# Patient Record
Sex: Female | Born: 1940 | Race: White | Hispanic: No | Marital: Married | Smoking: Former smoker
Health system: Southern US, Community
[De-identification: ages and names within clinical notes are randomized; demographics above are authoritative.]

## PROBLEM LIST (undated history)

## (undated) DIAGNOSIS — I10 Essential (primary) hypertension: Secondary | ICD-10-CM

## (undated) DIAGNOSIS — M4800 Spinal stenosis, site unspecified: Secondary | ICD-10-CM

## (undated) DIAGNOSIS — H409 Unspecified glaucoma: Secondary | ICD-10-CM

## (undated) DIAGNOSIS — E785 Hyperlipidemia, unspecified: Secondary | ICD-10-CM

## (undated) HISTORY — PX: TONSILLECTOMY: SUR1361

## (undated) HISTORY — PX: APPENDECTOMY: SHX54

## (undated) HISTORY — PX: CRANIOTOMY: SHX93

## (undated) HISTORY — PX: OTHER SURGICAL HISTORY: SHX169

---

## 2013-10-11 ENCOUNTER — Encounter: Payer: Self-pay | Admitting: Emergency Medicine

## 2013-10-11 ENCOUNTER — Emergency Department (INDEPENDENT_AMBULATORY_CARE_PROVIDER_SITE_OTHER)
Admission: EM | Admit: 2013-10-11 | Discharge: 2013-10-11 | Disposition: A | Discharge status: Home / Self Care | Payer: Medicare Other | Source: Home / Self Care | Attending: Family Medicine | Admitting: Family Medicine

## 2013-10-11 DIAGNOSIS — J069 Acute upper respiratory infection, unspecified: Secondary | ICD-10-CM

## 2013-10-11 DIAGNOSIS — H73011 Bullous myringitis, right ear: Secondary | ICD-10-CM

## 2013-10-11 DIAGNOSIS — H73019 Bullous myringitis, unspecified ear: Secondary | ICD-10-CM

## 2013-10-11 HISTORY — DX: Essential (primary) hypertension: I10

## 2013-10-11 HISTORY — DX: Hyperlipidemia, unspecified: E78.5

## 2013-10-11 MED ORDER — AZITHROMYCIN 250 MG PO TABS: ORAL_TABLET | ORAL | Status: DC

## 2013-10-11 MED ORDER — BENZONATATE 200 MG PO CAPS: 200.0000 mg | ORAL_CAPSULE | Freq: Every day | ORAL | Status: DC

## 2013-10-11 NOTE — ED Provider Notes (Signed)
CSN: [id number]     Arrival date & time 10/11/13  1721 History   First MD Initiated Contact with Patient 10/11/13 1748     Chief Complaint  Patient presents with  . URI  . Otalgia      HPI Comments: Patient developed URI symptoms 5 days ago with myalgias, fatigue, cough, hoarseness, headache, sore throat, and sinus congestion.  She has felt tightness in her anterior chest but no shortness of breath.  The history is provided by the patient.    Past Medical History  Diagnosis Date  . Hypertension   . Hyperlipidemia    Past Surgical History  Procedure Laterality Date  . Appendectomy    . Craniotomy    . Tonsillectomy    . Other surgical history      Infection in femur- drained, port added and removed   Family History  Problem Relation Age of Onset  . Hypertension Mother   . Diabetes Mother   . Alzheimer's disease Father    History  Substance Use Topics  . Smoking status: Former Games developer  . Smokeless tobacco: Never Used  . Alcohol Use: No   OB History   Grav Para Term Preterm Abortions TAB SAB Ect Mult Living                 Review of Systems + sore throat + cough + hoarseness No pleuritic pain No wheezing + nasal congestion + post-nasal drainage No sinus pain/pressure No itchy/red eyes + right earache No hemoptysis No SOB No fever, + chills No nausea No vomiting No abdominal pain No diarrhea No urinary symptoms No skin rash + fatigue + myalgias + headache Used OTC meds without relief  Allergies  Other  Home Medications   Current Outpatient Rx  Name  Route  Sig  Dispense  Refill  . azelastine (ASTELIN) 137 MCG/SPRAY nasal spray   Each Nare   Place into both nostrils 2 (two) times daily. Use in each nostril as directed         . fluticasone (VERAMYST) 27.5 MCG/SPRAY nasal spray   Nasal   Place 2 sprays into the nose daily.         Marland Kitchen levothyroxine (SYNTHROID, LEVOTHROID) 100 MCG tablet   Oral   Take 100 mcg by mouth daily before  breakfast.         . lisinopril (PRINIVIL,ZESTRIL) 10 MG tablet   Oral   Take 10 mg by mouth daily.         . nebivolol (BYSTOLIC) 10 MG tablet   Oral   Take 10 mg by mouth daily.         Marland Kitchen omeprazole (PRILOSEC) 40 MG capsule   Oral   Take 40 mg by mouth daily.         . pravastatin (PRAVACHOL) 40 MG tablet   Oral   Take 40 mg by mouth daily.         Marland Kitchen azithromycin (ZITHROMAX Z-PAK) 250 MG tablet      Take 2 tabs today; then begin one tab once daily for 4 more days.   6 each   0   . benzonatate (TESSALON) 200 MG capsule   Oral   Take 1 capsule (200 mg total) by mouth at bedtime.   12 capsule   0    BP 174/84  Pulse 68  Temp(Src) 98.1 F (36.7 C) (Oral)  Resp 14  Ht 5\' 9"  (1.753 m)  Wt 187 lb (84.823 kg)  BMI 27.60 kg/m2  SpO2 96% Physical Exam Nursing notes and Vital Signs reviewed. Appearance:  Patient appears healthy, stated age, and in no acute distress Eyes:  Pupils are equal, round, and reactive to light and accomodation.  Extraocular movement is intact.  Conjunctivae are not inflamed  Ears:  Canals normal.   Left tympanic membrane normal.  Right tympanic membrane is erythematous and bulging with several intact bullae present Nose:  Mildly congested turbinates.  No sinus tenderness.   Pharynx:  Normal Neck:  Supple.  Slightly tender shotty posterior nodes are palpated bilaterally  Lungs:  Clear to auscultation.  Breath sounds are equal.  Chest:  Distinct tenderness to palpation over the mid-sternum.  Heart:  Regular rate and rhythm without murmurs, rubs, or gallops.  Abdomen:  Nontender without masses or hepatosplenomegaly.  Bowel sounds are present.  No CVA or flank tenderness.  Extremities:  No edema.  No calf tenderness Skin:  No rash present.   ED Course  Procedures  none      MDM   1. Acute upper respiratory infections of unspecified site; suspect viral URI   2. Bullous myringitis of right ear; antibiotic choices are limited by  patient's multiple antibiotic allergies    Begin Z-pack.  Prescription written for Benzonatate [facility] to take at bedtime for night-time cough.  Take plain Mucinex (guaifenesin) twice daily for cough and congestion.  Increase fluid intake, rest. Continue nasal sprays.   Also recommend using saline nasal spray several times daily and saline nasal irrigation (AYR is a common brand) Stop all antihistamines for now, and other non-prescription cough/cold preparations. Followup with ENT in two days.    Lattie Haw, MD 10/11/13 [phone]

## 2013-10-11 NOTE — ED Notes (Signed)
Meredith Wilkins c/o uri x 5 days without fever. Developed right ear pain today.

## 2013-10-15 ENCOUNTER — Telehealth: Payer: Self-pay | Admitting: Emergency Medicine

## 2013-12-01 ENCOUNTER — Emergency Department (INDEPENDENT_AMBULATORY_CARE_PROVIDER_SITE_OTHER)
Admission: EM | Admit: 2013-12-01 | Discharge: 2013-12-01 | Disposition: A | Discharge status: Home / Self Care | Payer: Medicare Other | Source: Home / Self Care | Attending: Emergency Medicine | Admitting: Emergency Medicine

## 2013-12-01 ENCOUNTER — Emergency Department (INDEPENDENT_AMBULATORY_CARE_PROVIDER_SITE_OTHER): Payer: Medicare Other

## 2013-12-01 ENCOUNTER — Encounter: Payer: Self-pay | Admitting: Emergency Medicine

## 2013-12-01 DIAGNOSIS — S60221A Contusion of right hand, initial encounter: Secondary | ICD-10-CM

## 2013-12-01 DIAGNOSIS — S60229A Contusion of unspecified hand, initial encounter: Secondary | ICD-10-CM

## 2013-12-01 DIAGNOSIS — S60211A Contusion of right wrist, initial encounter: Secondary | ICD-10-CM

## 2013-12-01 DIAGNOSIS — M25439 Effusion, unspecified wrist: Secondary | ICD-10-CM

## 2013-12-01 DIAGNOSIS — S60219A Contusion of unspecified wrist, initial encounter: Secondary | ICD-10-CM

## 2013-12-01 DIAGNOSIS — M25539 Pain in unspecified wrist: Secondary | ICD-10-CM

## 2013-12-01 DIAGNOSIS — M7989 Other specified soft tissue disorders: Secondary | ICD-10-CM

## 2013-12-01 NOTE — ED Notes (Signed)
Meredith Wilkins fell and hit her hand on Sunday. She is concerned of a blood clot. The pain is a 4/10 on the pain scale.

## 2013-12-01 NOTE — ED Provider Notes (Signed)
CSN: [id number]     Arrival date & time 12/01/13  1022 History   First MD Initiated Contact with Patient 12/01/13 1023     Chief Complaint  Patient presents with  . Hand Injury    right hand injury    Patient is a 72 y.o. female presenting with hand injury. The history is provided by the patient.  Hand Injury Location:  Hand and wrist Time since incident:  5 days Injury: yes   Mechanism of injury: fall   Fall:    Fall occurred: While walking outside. Accidentally slipped. No loss of consciousness or head injury.   Impact surface:  Concrete   Point of impact:  Outstretched arms   Entrapped after fall: no   Wrist location:  R wrist Hand location:  R hand (Especially thumb) Pain details:    Quality:  Sharp   Radiates to:  Does not radiate   Severity:  Moderate   Onset quality:  Sudden   Timing:  Constant   Progression:  Unchanged Chronicity:  New Prior injury to area:  Unable to specify Relieved by:  Rest Worsened by:  Movement Ineffective treatments:  None tried Associated symptoms: decreased range of motion and swelling   Associated symptoms: no back pain, no fever, no muscle weakness, no neck pain, no numbness and no tingling   Risk factors: known bone disorder (Osteopenia)   Risk factors: no concern for non-accidental trauma and no frequent fractures     Past Medical History  Diagnosis Date  . Hypertension   . Hyperlipidemia    Past Surgical History  Procedure Laterality Date  . Appendectomy    . Craniotomy    . Tonsillectomy    . Other surgical history      Infection in femur- drained, port added and removed   Family History  Problem Relation Age of Onset  . Hypertension Mother   . Diabetes Mother   . Alzheimer's disease Father    History  Substance Use Topics  . Smoking status: Former Games developer  . Smokeless tobacco: Never Used  . Alcohol Use: No   OB History   Grav Para Term Preterm Abortions TAB SAB Ect Mult Living                 Review of  Systems  Constitutional: Negative for fever.  Musculoskeletal: Negative for back pain and neck pain.  All other systems reviewed and are negative.    Allergies  Other  Home Medications   Current Outpatient Rx  Name  Route  Sig  Dispense  Refill  . azelastine (ASTELIN) 137 MCG/SPRAY nasal spray   Each Nare   Place into both nostrils 2 (two) times daily. Use in each nostril as directed         . azithromycin (ZITHROMAX Z-PAK) 250 MG tablet      Take 2 tabs today; then begin one tab once daily for 4 more days.   6 each   0   . benzonatate (TESSALON) 200 MG capsule   Oral   Take 1 capsule (200 mg total) by mouth at bedtime.   12 capsule   0   . fluticasone (VERAMYST) 27.5 MCG/SPRAY nasal spray   Nasal   Place 2 sprays into the nose daily.         Marland Kitchen levothyroxine (SYNTHROID, LEVOTHROID) 100 MCG tablet   Oral   Take 100 mcg by mouth daily before breakfast.         . lisinopril (  PRINIVIL,ZESTRIL) 10 MG tablet   Oral   Take 10 mg by mouth daily.         . nebivolol (BYSTOLIC) 10 MG tablet   Oral   Take 10 mg by mouth daily.         Marland Kitchen omeprazole (PRILOSEC) 40 MG capsule   Oral   Take 40 mg by mouth daily.         . pravastatin (PRAVACHOL) 40 MG tablet   Oral   Take 40 mg by mouth daily.          BP 166/89  Pulse 56  Temp(Src) 98.2 F (36.8 C) (Oral)  Ht 5\' 9"  (1.753 m)  Wt 193 lb (87.544 kg)  BMI 28.49 kg/m2  SpO2 97% Physical Exam  Nursing note and vitals reviewed. Constitutional: She is oriented to person, place, and time. She appears well-developed and well-nourished. No distress.  HENT:  Head: Normocephalic and atraumatic.  Eyes: Conjunctivae and EOM are normal. Pupils are equal, round, and reactive to light. No scleral icterus.  Neck: Normal range of motion.  Cardiovascular: Normal rate.   Pulmonary/Chest: Effort normal.  Abdominal: She exhibits no distension.  Musculoskeletal:       Right wrist: She exhibits decreased range of  motion, bony tenderness (Distal radius, but nontender over the navicular) and swelling. She exhibits no laceration.       Right hand: She exhibits decreased range of motion, bony tenderness (Especially proximal thumb) and swelling. Normal sensation noted. Normal strength noted.  Diffuse ecchymosis right hand and right wrist, especially right first metacarpal area and distal radius. Skin is intact. No red streaks. No cords or sign of phlebitis. No fluctuance. Neurovascular distally intact.  Neurological: She is alert and oriented to person, place, and time.  Skin: Skin is warm. No rash noted.  Psychiatric: She has a normal mood and affect. Her behavior is normal. Cognition and memory are normal.    ED Course  Procedures (including critical care time) Labs Review Labs Reviewed - No data to display Imaging Review RIGHT WRIST - COMPLETE 3+ VIEW  COMPARISON: None.  FINDINGS:  No fracture or dislocation is seen.  The joint spaces are preserved.  Mild soft tissue swelling along the dorsal wrist.  IMPRESSION:  No fracture or dislocation is seen.  Electronically Signed  By: Charline Bills M.D.  On: 12/01/2013 11:38  EXAM:  RIGHT HAND - COMPLETE 3+ VIEW  COMPARISON: None.  FINDINGS:  No fracture or dislocation is seen.  The joint spaces are preserved.  Visualized soft tissues are grossly unremarkable.  IMPRESSION:  No fracture or dislocation is seen.  Electronically Signed  By: Charline Bills M.D.  On: 12/01/2013 11:37  EKG Interpretation    Date/Time:    Ventricular Rate:    PR Interval:    QRS Duration:   QT Interval:    QTC Calculation:   R Axis:     Text Interpretation:              MDM   1. Contusion, wrist, right, initial encounter   2. Contusion of right hand, initial encounter    Reviewed with patient that x-rays right hand and right wrist and negative for any fracture or dislocation. Placed in Ace bandage and thumb Spica R wrist splint.--These  significantly reduced her pain. Encourage rest, ice, compression with ACE bandage, and elevation of injured body part. Discussed OTC pain meds such as Tylenol or ibuprofen up to 600 mg TID PC. She declined prescription for  pain med Follow-up with orthopedist within one week, sooner pay for certain symptoms. Precautions discussed. Red flags discussed. Questions invited and answered. Patient voiced understanding and agreement.     Lajean Manes, MD 12/03/13 [phone]

## 2015-01-14 ENCOUNTER — Encounter: Payer: Self-pay | Admitting: Emergency Medicine

## 2015-01-14 ENCOUNTER — Emergency Department (INDEPENDENT_AMBULATORY_CARE_PROVIDER_SITE_OTHER)
Admission: EM | Admit: 2015-01-14 | Discharge: 2015-01-14 | Disposition: A | Discharge status: Home / Self Care | Payer: Medicare Other | Source: Home / Self Care | Attending: Family Medicine | Admitting: Family Medicine

## 2015-01-14 DIAGNOSIS — R300 Dysuria: Secondary | ICD-10-CM

## 2015-01-14 DIAGNOSIS — N3090 Cystitis, unspecified without hematuria: Secondary | ICD-10-CM

## 2015-01-14 HISTORY — DX: Unspecified glaucoma: H40.9

## 2015-01-14 HISTORY — DX: Spinal stenosis, site unspecified: M48.00

## 2015-01-14 LAB — POCT URINALYSIS DIP (MANUAL ENTRY)
BILIRUBIN UA: NEGATIVE
GLUCOSE UA: NEGATIVE
Ketones, POC UA: NEGATIVE
Nitrite, UA: NEGATIVE
SPEC GRAV UA: 1.015 (ref 1.005–1.03)
UROBILINOGEN UA: 0.2 (ref 0–1)
pH, UA: 8.5 (ref 5–8)

## 2015-01-14 MED ORDER — NITROFURANTOIN MONOHYD MACRO 100 MG PO CAPS: 100.0000 mg | ORAL_CAPSULE | Freq: Two times a day (BID) | ORAL | Status: AC

## 2015-01-14 MED ORDER — NITROFURANTOIN MONOHYD MACRO 100 MG PO CAPS: 100.0000 mg | ORAL_CAPSULE | Freq: Two times a day (BID) | ORAL | Status: DC

## 2015-01-14 NOTE — ED Provider Notes (Signed)
CSN: [id number]     Arrival date & time 01/14/15  1414 History   First MD Initiated Contact with Patient 01/14/15 1453     Chief Complaint  Patient presents with  . Dysuria      HPI Comments: Patient complains of three day history of cloudy malodorous urine, developing dysuria last night.  No abdominal pain or fevers, chills, and sweats.  Patient is a 74 y.o. female presenting with dysuria. The history is provided by the patient.  Dysuria Pain quality:  Burning Pain severity:  Mild Onset quality:  Gradual Duration:  3 days Timing:  Constant Progression:  Worsening Chronicity:  New Recent urinary tract infections: no   Relieved by:  None tried Worsened by:  Nothing tried Ineffective treatments:  None tried Urinary symptoms: discolored urine, foul-smelling urine, frequent urination and incontinence   Urinary symptoms: no hematuria and no hesitancy   Associated symptoms: no abdominal pain, no fever, no flank pain, no nausea and no vomiting   Risk factors: recurrent urinary tract infections     Past Medical History  Diagnosis Date  . Hypertension   . Hyperlipidemia   . Spinal stenosis   . Glaucoma    Past Surgical History  Procedure Laterality Date  . Appendectomy    . Craniotomy    . Tonsillectomy    . Other surgical history      Infection in femur- drained, port added and removed   Family History  Problem Relation Age of Onset  . Hypertension Mother   . Diabetes Mother   . Alzheimer's disease Father    History  Substance Use Topics  . Smoking status: Former Games developermoker  . Smokeless tobacco: Never Used  . Alcohol Use: No   OB History    No data available     Review of Systems  Constitutional: Negative for fever.  Gastrointestinal: Negative for nausea, vomiting and abdominal pain.  Genitourinary: Positive for dysuria. Negative for flank pain.  All other systems reviewed and are negative.   Allergies  Other  Home Medications   Prior to Admission medications    Medication Sig Start Date End Date Taking? Authorizing Provider  azelastine (ASTELIN) 137 MCG/SPRAY nasal spray Place into both nostrils 2 (two) times daily. Use in each nostril as directed    Historical Provider, MD  fluticasone (VERAMYST) 27.5 MCG/SPRAY nasal spray Place 2 sprays into the nose daily.    Historical Provider, MD  levothyroxine (SYNTHROID, LEVOTHROID) 100 MCG tablet Take 100 mcg by mouth daily before breakfast.    Historical Provider, MD  lisinopril (PRINIVIL,ZESTRIL) 10 MG tablet Take 10 mg by mouth daily.    Historical Provider, MD  nebivolol (BYSTOLIC) 10 MG tablet Take 10 mg by mouth daily.    Historical Provider, MD  nitrofurantoin, macrocrystal-monohydrate, (MACROBID) 100 MG capsule Take 1 capsule (100 mg total) by mouth 2 (two) times daily. Take with food. 01/14/15   Lattie HawStephen A Alissia Lory, MD  omeprazole (PRILOSEC) 40 MG capsule Take 40 mg by mouth daily.    Historical Provider, MD  pravastatin (PRAVACHOL) 40 MG tablet Take 40 mg by mouth daily.    Historical Provider, MD   BP 151/86 mmHg  Pulse 75  Temp(Src) 97.9 F (36.6 C) (Oral)  Ht 5\' 9"  (1.753 m)  Wt 208 lb (94.348 kg)  BMI 30.70 kg/m2  SpO2 96% Physical Exam Nursing notes and Vital Signs reviewed. Appearance:  Patient appears stated age, and in no acute distress.  Patient is obese (BMI 30.7) Eyes:  Pupils are equal, round, and reactive to light and accomodation.  Extraocular movement is intact.  Conjunctivae are not inflamed  Nose:  Normal Pharynx:  Normal; moist mucous membranes  Neck:  Supple.   No adenopathy Lungs:  Clear to auscultation.  Breath sounds are equal.  Heart:  Regular rate and rhythm without murmurs, rubs, or gallops.  Abdomen:  Nontender without masses or hepatosplenomegaly.  Bowel sounds are present.  No CVA or flank tenderness.  Extremities:  No edema.  No calf tenderness Skin:  No rash present.   ED Course  Procedures  None     Labs Reviewed  URINE CULTURE  POCT URINALYSIS DIP (MANUAL  ENTRY):  BLO small; PRO trace; LEU large; otherwise negative      MDM   1. Dysuria   2. Cystitis    Urine culture pending.  Begin Macrobid  BID for one week. Continue increased fluid intake. If symptoms become significantly worse during the night or over the weekend, proceed to the local emergency room.  Followup with Family Doctor if not improved in one week.     Lattie Haw, MD 01/14/15 [phone]

## 2015-01-14 NOTE — Discharge Instructions (Signed)
Continue increased fluid intake. °If symptoms become significantly worse during the night or over the weekend, proceed to the local emergency room.  ° ° °Urinary Tract Infection °Urinary tract infections (UTIs) can develop anywhere along your urinary tract. Your urinary tract is your body's drainage system for removing wastes and extra water. Your urinary tract includes two kidneys, two ureters, a bladder, and a urethra. Your kidneys are a pair of bean-shaped organs. Each kidney is about the size of your fist. They are located below your ribs, one on each side of your spine. °CAUSES °Infections are caused by microbes, which are microscopic organisms, including fungi, viruses, and bacteria. These organisms are so small that they can only be seen through a microscope. Bacteria are the microbes that most commonly cause UTIs. °SYMPTOMS  °Symptoms of UTIs may vary by age and gender of the patient and by the location of the infection. Symptoms in young women typically include a frequent and intense urge to urinate and a painful, burning feeling in the bladder or urethra during urination. Older women and men are more likely to be tired, shaky, and weak and have muscle aches and abdominal pain. A fever may mean the infection is in your kidneys. Other symptoms of a kidney infection include pain in your back or sides below the ribs, nausea, and vomiting. °DIAGNOSIS °To diagnose a UTI, your caregiver will ask you about your symptoms. Your caregiver also will ask to provide a urine sample. The urine sample will be tested for bacteria and white blood cells. White blood cells are made by your body to help fight infection. °TREATMENT  °Typically, UTIs can be treated with medication. Because most UTIs are caused by a bacterial infection, they usually can be treated with the use of antibiotics. The choice of antibiotic and length of treatment depend on your symptoms and the type of bacteria causing your infection. °HOME CARE  INSTRUCTIONS °· If you were prescribed antibiotics, take them exactly as your caregiver instructs you. Finish the medication even if you feel better after you have only taken some of the medication. °· Drink enough water and fluids to keep your urine clear or pale yellow. °· Avoid caffeine, tea, and carbonated beverages. They tend to irritate your bladder. °· Empty your bladder often. Avoid holding urine for long periods of time. °· Empty your bladder before and after sexual intercourse. °· After a bowel movement, women should cleanse from front to back. Use each tissue only once. °SEEK MEDICAL CARE IF:  °· You have back pain. °· You develop a fever. °· Your symptoms do not begin to resolve within 3 days. °SEEK IMMEDIATE MEDICAL CARE IF:  °· You have severe back pain or lower abdominal pain. °· You develop chills. °· You have nausea or vomiting. °· You have continued burning or discomfort with urination. °MAKE SURE YOU:  °· Understand these instructions. °· Will watch your condition. °· Will get help right away if you are not doing well or get worse. °Document Released: 09/02/2005 Document Revised: 05/24/2012 Document Reviewed: 01/01/2012 °ExitCare® Patient Information ©2015 [employer]. This information is not intended to replace advice given to you by your health care provider. Make sure you discuss any questions you have with your health care provider. ° °

## 2015-01-14 NOTE — ED Notes (Signed)
Dysuria, cloudy urine, shy bladder x 5 days

## 2015-01-17 ENCOUNTER — Telehealth: Payer: Self-pay | Admitting: *Deleted

## 2015-01-17 LAB — URINE CULTURE: Colony Count: >=100000

## 2015-02-18 ENCOUNTER — Emergency Department (INDEPENDENT_AMBULATORY_CARE_PROVIDER_SITE_OTHER)
Admission: EM | Admit: 2015-02-18 | Discharge: 2015-02-18 | Disposition: A | Discharge status: Home / Self Care | Payer: Medicare Other | Source: Home / Self Care | Attending: Family Medicine | Admitting: Family Medicine

## 2015-02-18 ENCOUNTER — Encounter: Payer: Self-pay | Admitting: *Deleted

## 2015-02-18 DIAGNOSIS — T7840XA Allergy, unspecified, initial encounter: Secondary | ICD-10-CM | POA: Diagnosis not present

## 2015-02-18 DIAGNOSIS — L304 Erythema intertrigo: Secondary | ICD-10-CM | POA: Diagnosis not present

## 2015-02-18 IMAGING — CR DG WRIST COMPLETE 3+V*R*
4 series · 4 of 4 positions shown · non-contrast
Comparison: None.

CLINICAL DATA: Fall, pain/swelling to hand and wrist

EXAM:
RIGHT WRIST - COMPLETE 3+ VIEW

[view not recorded (1 of 4)]
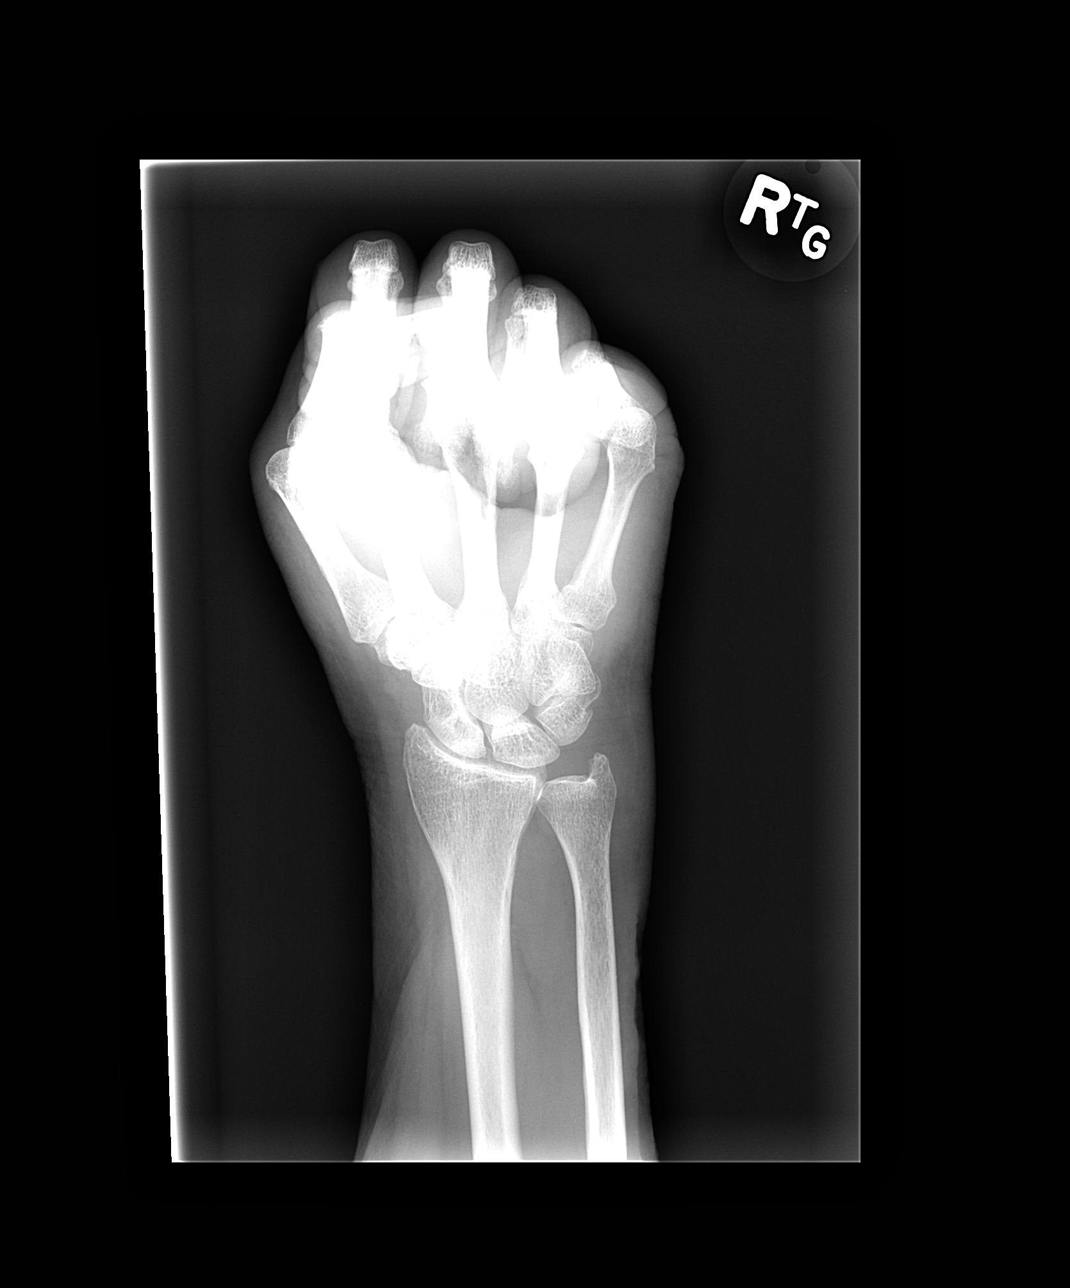

[view not recorded (2 of 4)]
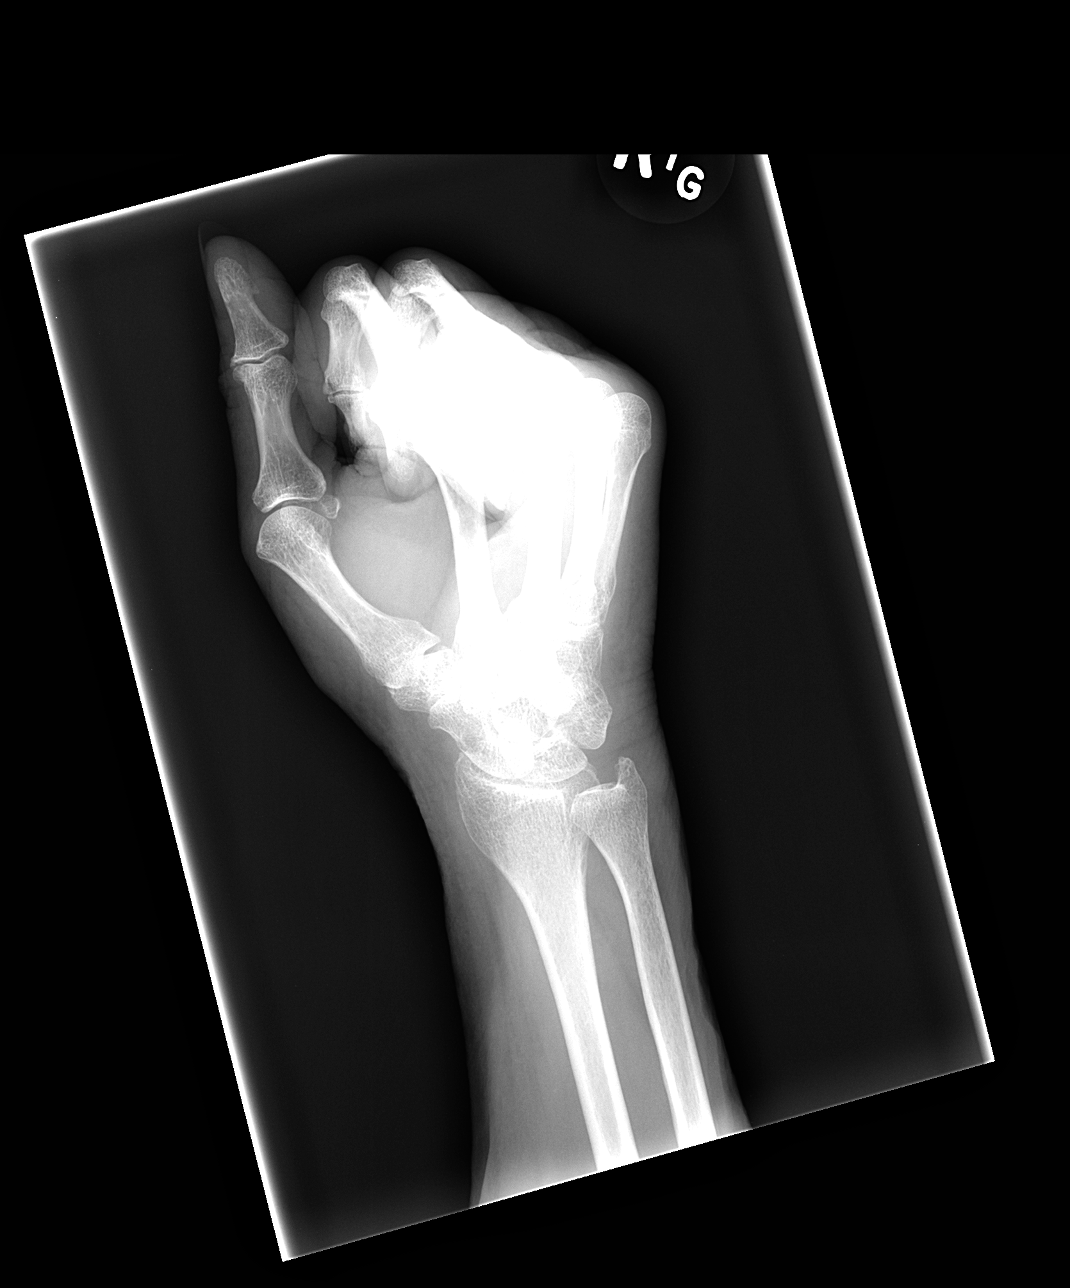

[view not recorded (3 of 4)]
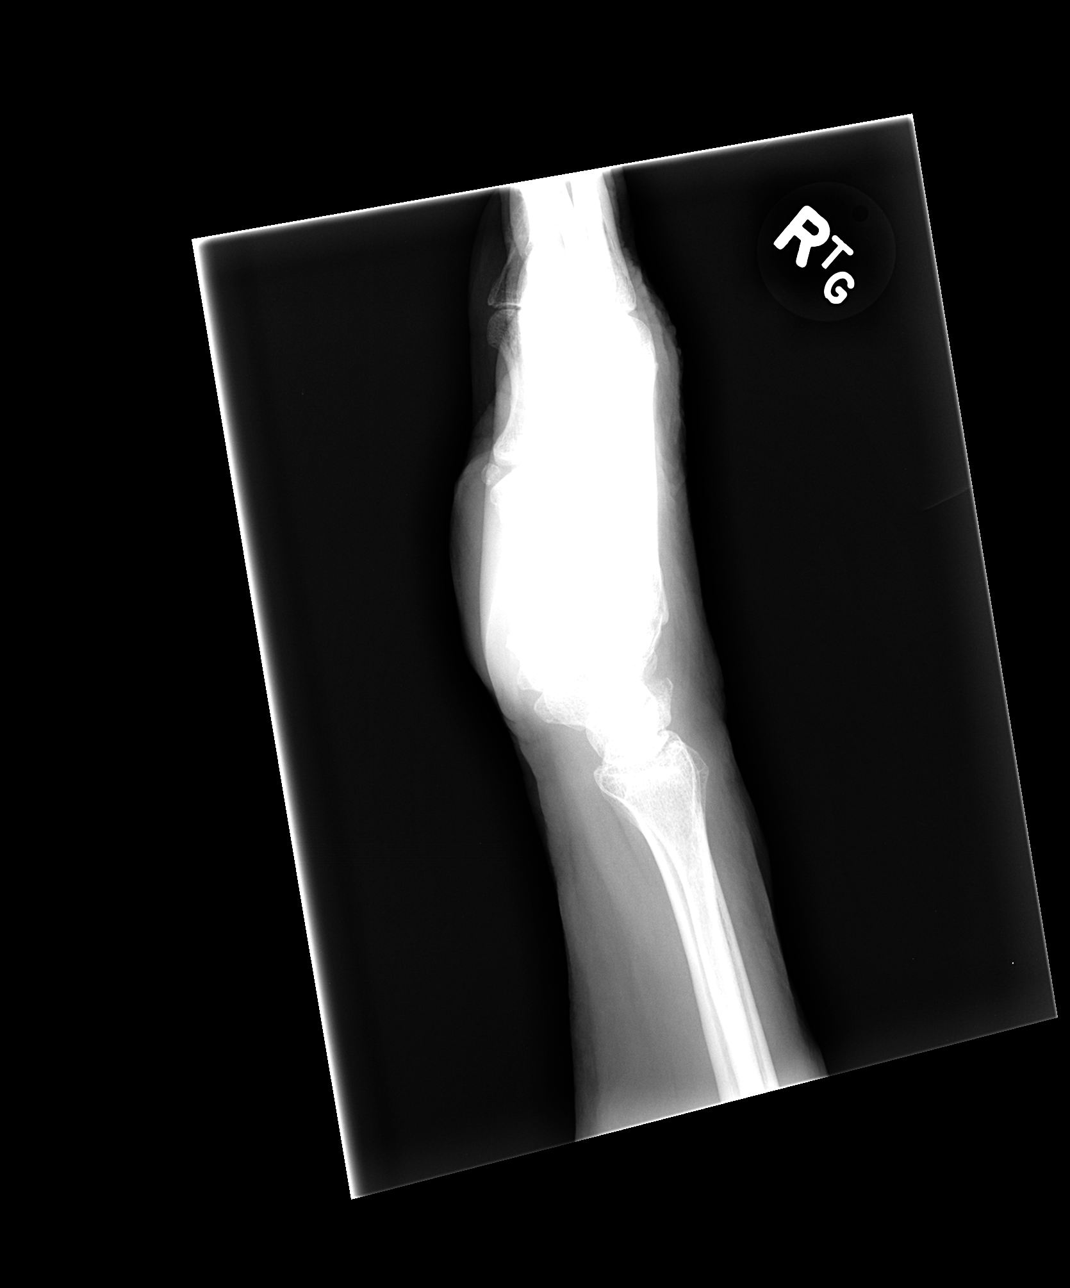

[view not recorded (4 of 4)]
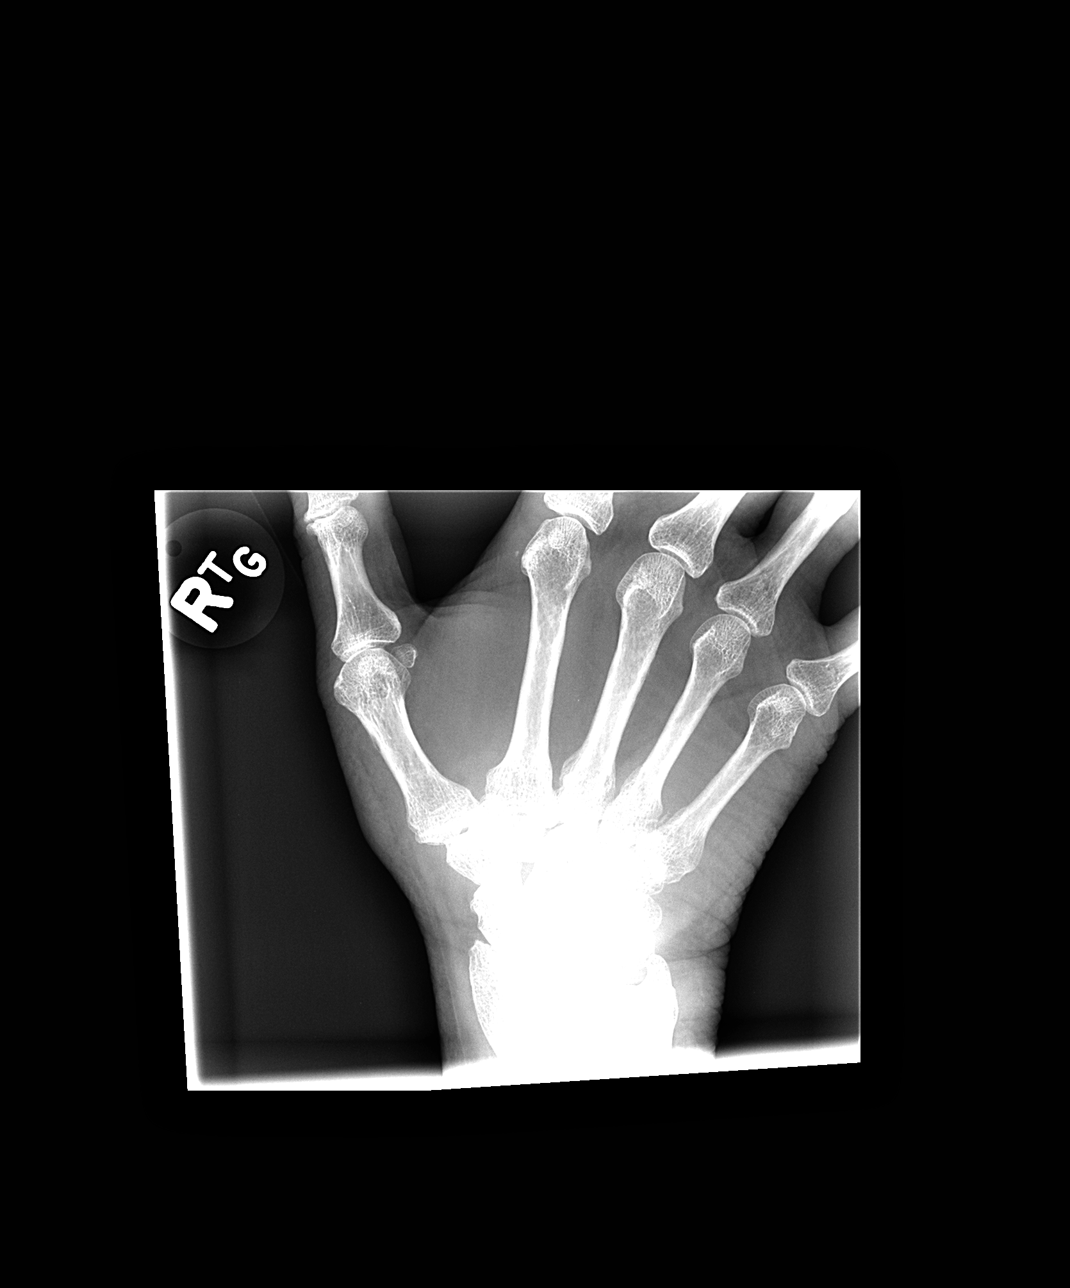

[4 of 4 positions shown; findings below may reference images not displayed]

FINDINGS: No fracture or dislocation is seen.

The joint spaces are preserved.

Mild soft tissue swelling along the dorsal wrist.
IMPRESSION: No fracture or dislocation is seen.

## 2015-02-18 NOTE — ED Notes (Signed)
Pt c/o allergic reaction with swelling of her hands and feet x 1 day. She also c/o raw, itchy area under her LT breast. She completed an ABT 2 days ago.

## 2015-02-18 NOTE — Discharge Instructions (Signed)
Take Benadryl 50mg  at bedtime tonight, then tomorrow resume Allegra daily.  Apply 1% Clotrimazole to rash under breasts twice daily until resolved.   Intertrigo Intertrigo is a skin condition that occurs in between folds of skin in places on the body that rub together a lot and do not get much ventilation. It is caused by heat, moisture, friction, sweat retention, and lack of air circulation, which produces red, irritated patches and, sometimes, scaling or drainage. People who have diabetes, who are obese, or who have treatment with antibiotics are at increased risk for intertrigo. The most common sites for intertrigo to occur include:  The groin.  The breasts.  The armpits.  Folds of abdominal skin.  Webbed spaces between the fingers or toes. Intertrigo may be aggravated by:  Sweat.  Feces.  Yeast or bacteria that are present near skin folds.  Urine.  Vaginal discharge. HOME CARE INSTRUCTIONS  The following steps can be taken to reduce friction and keep the affected area cool and dry:  Expose skin folds to the air.  Keep deep skin folds separated with cotton or linen cloth. Avoid tight fitting clothing that could cause chafing.  Wear open-toed shoes or sandals to help reduce moisture between the toes.  Apply absorbent powders to affected areas as directed by your caregiver.  Apply over-the-counter barrier pastes, such as zinc oxide, as directed by your caregiver.  If you develop a fungal infection in the affected area, your caregiver may have you use antifungal creams. SEEK MEDICAL CARE IF:   The rash is not improving after 1 week of treatment.  The rash is getting worse (more red, more swollen, more painful, or spreading).  You have a fever or chills. MAKE SURE YOU:   Understand these instructions.  Will watch your condition.  Will get help right away if you are not doing well or get worse. Document Released: 11/23/2005 Document Revised: 02/15/2012 Document  Reviewed: 05/08/2010 [facility] Patient Information 2015 [place], [place]. This information is not intended to replace advice given to you by your health care provider. Make sure you discuss any questions you have with your health care provider.

## 2015-02-18 NOTE — ED Provider Notes (Signed)
CSN: [id number]     Arrival date & time 02/18/15  1740 History   First MD Initiated Contact with Patient 02/18/15 1947     Chief Complaint  Patient presents with  . Allergic Reaction      HPI Comments: Patient reports that she fell one week ago, injuring her head resulting in a laceration.  No loss of consciousness.  She was evaluated in ER and laceration was sutured.  CT scan was negative except for presence of sinusitis.  She was subsequently treated with cefdinir.  She states that she received a Tdap three days ago. Three days ago she developed mild swelling of her hands and lower extremities.  She has also noted a pruritic rash beneath both breasts.  She feels well otherwise.  She states that she has finished taking cefdinir.  No shortness of breath or wheezing.  The history is provided by the patient.    Past Medical History  Diagnosis Date  . Hypertension   . Hyperlipidemia   . Spinal stenosis   . Glaucoma    Past Surgical History  Procedure Laterality Date  . Appendectomy    . Craniotomy    . Tonsillectomy    . Other surgical history      Infection in femur- drained, port added and removed   Family History  Problem Relation Age of Onset  . Hypertension Mother   . Diabetes Mother   . Alzheimer's disease Father    History  Substance Use Topics  . Smoking status: Former Games developer  . Smokeless tobacco: Never Used  . Alcohol Use: No   OB History    No data available     Review of Systems  Constitutional: Negative.   HENT: Negative.   Eyes: Negative.   Respiratory: Negative.   Cardiovascular:       Mild hand and lower leg swelling  Gastrointestinal: Negative.   Genitourinary: Negative.   Musculoskeletal: Negative.   Skin:       Pruritic rash beneath breasts.  All other systems reviewed and are negative.   Allergies  Other  Home Medications   Prior to Admission medications   Medication Sig Start Date End Date Taking? Authorizing Provider  azelastine  (ASTELIN) 137 MCG/SPRAY nasal spray Place into both nostrils 2 (two) times daily. Use in each nostril as directed    Historical Provider, MD  fluticasone (VERAMYST) 27.5 MCG/SPRAY nasal spray Place 2 sprays into the nose daily.    Historical Provider, MD  levothyroxine (SYNTHROID, LEVOTHROID) 100 MCG tablet Take 100 mcg by mouth daily before breakfast.    Historical Provider, MD  lisinopril (PRINIVIL,ZESTRIL) 10 MG tablet Take 10 mg by mouth daily.    Historical Provider, MD  nebivolol (BYSTOLIC) 10 MG tablet Take 10 mg by mouth daily.    Historical Provider, MD  nitrofurantoin, macrocrystal-monohydrate, (MACROBID) 100 MG capsule Take 1 capsule (100 mg total) by mouth 2 (two) times daily. Take with food. 01/14/15   Lattie Haw, MD  omeprazole (PRILOSEC) 40 MG capsule Take 40 mg by mouth daily.    Historical Provider, MD  pravastatin (PRAVACHOL) 40 MG tablet Take 40 mg by mouth daily.    Historical Provider, MD   BP 145/95 mmHg  Pulse 58  Temp(Src) 98.1 F (36.7 C) (Oral)  Resp 16  Ht  (1.753 m)  Wt 208 lb (94.348 kg)  BMI 30.70 kg/m2  SpO2 96% Physical Exam  Constitutional: She is oriented to person, place, and time. She appears well-developed  and well-nourished. No distress.  HENT:  Mouth/Throat: Oropharynx is clear and moist.  Eyes: Conjunctivae are normal. Pupils are equal, round, and reactive to light.  Neck: Neck supple.  Cardiovascular: Normal heart sounds.   Pulmonary/Chest: Breath sounds normal.    The intertriginous areas beneath both breasts are mildly erythematous as noted on diagram.  No weeping or tenderness to palpation.    Abdominal: There is no tenderness.  Lymphadenopathy:    She has no cervical adenopathy.  Neurological: She is alert and oriented to person, place, and time.  Skin: Skin is warm and dry. No rash noted.  Nursing note and vitals reviewed.   ED Course  Procedures  none   MDM   1. Allergic reaction, initial encounter; ?etiology  2.  Intertrigo     Take Benadryl 50mg  at bedtime tonight, then tomorrow resume Allegra daily.  Apply 1% Clotrimazole to rash under breasts twice daily until resolved.    Lattie HawStephen A Howie Rufus, MD 02/19/15 [phone]

## 2015-10-10 ENCOUNTER — Encounter: Payer: Self-pay | Admitting: Emergency Medicine

## 2015-10-10 ENCOUNTER — Emergency Department (INDEPENDENT_AMBULATORY_CARE_PROVIDER_SITE_OTHER)
Admission: EM | Admit: 2015-10-10 | Discharge: 2015-10-10 | Disposition: A | Discharge status: Home / Self Care | Payer: Medicare Other | Source: Home / Self Care | Attending: Family Medicine | Admitting: Family Medicine

## 2015-10-10 DIAGNOSIS — R300 Dysuria: Secondary | ICD-10-CM | POA: Diagnosis not present

## 2015-10-10 DIAGNOSIS — N3090 Cystitis, unspecified without hematuria: Secondary | ICD-10-CM

## 2015-10-10 LAB — POCT URINALYSIS DIP (MANUAL ENTRY)
BILIRUBIN UA: NEGATIVE
Bilirubin, UA: NEGATIVE
GLUCOSE UA: NEGATIVE
Nitrite, UA: NEGATIVE
Protein Ur, POC: 100 — AB
Spec Grav, UA: 1.015 (ref 1.005–1.03)
Urobilinogen, UA: 0.2 (ref 0–1)
pH, UA: 8.5 (ref 5–8)

## 2015-10-10 MED ORDER — PENICILLIN V POTASSIUM 500 MG PO TABS: 500.0000 mg | ORAL_TABLET | Freq: Three times a day (TID) | ORAL | Status: AC

## 2015-10-10 NOTE — ED Notes (Signed)
Dysuria x 2 days.

## 2015-10-10 NOTE — Discharge Instructions (Signed)
Continue increased fluid intake.  May use non-prescription AZO for about two days, if desired, to decrease urinary discomfort.   If symptoms become significantly worse during the night or over the weekend, proceed to the local emergency room.  

## 2015-10-10 NOTE — ED Provider Notes (Signed)
CSN: [id number]     Arrival date & time 10/10/15  1551 History   First MD Initiated Contact with Patient 10/10/15 1624     Chief Complaint  Patient presents with  . Dysuria      HPI Comments: Yesterday patient developed dysuria, frequency, hesitancy, intermittent incontinence, and cloudy urine.  She states that she has a UTI about every 3 months.  Patient is a 74 y.o. female presenting with dysuria. The history is provided by the patient.  Dysuria Pain quality:  Burning Pain severity:  Mild Onset quality:  Sudden Duration:  1 day Timing:  Constant Progression:  Worsening Chronicity:  Recurrent Recent urinary tract infections: yes   Relieved by:  None tried Ineffective treatments:  Phenazopyridine Urinary symptoms: frequent urination, hesitancy and incontinence   Urinary symptoms: no discolored urine, no foul-smelling urine and no hematuria   Associated symptoms: no abdominal pain, no fever, no flank pain and no nausea   Risk factors: recurrent urinary tract infections     Past Medical History  Diagnosis Date  . Hypertension   . Hyperlipidemia   . Spinal stenosis   . Glaucoma    Past Surgical History  Procedure Laterality Date  . Appendectomy    . Craniotomy    . Tonsillectomy    . Other surgical history      Infection in femur- drained, port added and removed   Family History  Problem Relation Age of Onset  . Hypertension Mother   . Diabetes Mother   . Alzheimer's disease Father    Social History  Substance Use Topics  . Smoking status: Former Games developermoker  . Smokeless tobacco: Never Used  . Alcohol Use: No   OB History    No data available     Review of Systems  Constitutional: Negative for fever.  Gastrointestinal: Negative for nausea and abdominal pain.  Genitourinary: Positive for dysuria. Negative for flank pain.  All other systems reviewed and are negative.   Allergies  Other  Home Medications   Prior to Admission medications   Medication Sig  Start Date End Date Taking? Authorizing Provider  azelastine (ASTELIN) 137 MCG/SPRAY nasal spray Place into both nostrils 2 (two) times daily. Use in each nostril as directed    Historical Provider, MD  fluticasone (VERAMYST) 27.5 MCG/SPRAY nasal spray Place 2 sprays into the nose daily.    Historical Provider, MD  levothyroxine (SYNTHROID, LEVOTHROID) 100 MCG tablet Take 100 mcg by mouth daily before breakfast.    Historical Provider, MD  lisinopril (PRINIVIL,ZESTRIL) 10 MG tablet Take 10 mg by mouth daily.    Historical Provider, MD  nebivolol (BYSTOLIC) 10 MG tablet Take 10 mg by mouth daily.    Historical Provider, MD  nitrofurantoin, macrocrystal-monohydrate, (MACROBID) 100 MG capsule Take 1 capsule (100 mg total) by mouth 2 (two) times daily. Take with food. 01/14/15   Lattie HawStephen A Beese, MD  omeprazole (PRILOSEC) 40 MG capsule Take 40 mg by mouth daily.    Historical Provider, MD  penicillin v potassium (VEETID) 500 MG tablet Take 1 tablet (500 mg total) by mouth 3 (three) times daily. 10/10/15   Lattie HawStephen A Beese, MD  pravastatin (PRAVACHOL) 40 MG tablet Take 40 mg by mouth daily.    Historical Provider, MD   Meds Ordered and Administered this Visit  Medications - No data to display  BP 131/83 mmHg  Pulse 70  Temp(Src) 98.3 F (36.8 C) (Oral)  Ht 5\' 9"  (1.753 m)  Wt 200 lb (90.719 kg)  BMI 29.52 kg/m2  SpO2 96% No data found.   Physical Exam Nursing notes and Vital Signs reviewed. Appearance:  Patient appears stated age, and in no acute distress Eyes:  Pupils are equal, round, and reactive to light and accomodation.  Extraocular movement is intact.  Conjunctivae are not inflamed  Nose:  Normal Pharynx:  Normal Neck:  Supple.  No adenopathy Lungs:  Clear to auscultation.  Breath sounds are equal.  Moving air well. Heart:  Regular rate and rhythm without murmurs, rubs, or gallops.  Abdomen:  Nontender without masses or hepatosplenomegaly.  Bowel sounds are present.  No CVA or flank  tenderness.  Extremities:  No edema.  No calf tenderness Skin:  No rash present.   ED Course  Procedures  None    Labs Reviewed  POCT URINALYSIS DIP (MANUAL ENTRY) - Abnormal; Notable for the following:    Clarity, UA cloudy (*)    Blood, UA moderate (*)    Protein Ur, POC =100 (*)    Leukocytes, UA moderate (2+) (*)    All other components within normal limits  URINE CULTURE    MDM   1. Dysuria   2. Cystitis    Urine culture pending.  Patient states that she can only take penicillin. Review of records:  Previous urine culture done 01/14/15 positive for proteus mirabilis sensitive to ampicillin. Begin Pen VK  Q8hr. Continue increased fluid intake.  May use non-prescription AZO for about two days, if desired, to decrease urinary discomfort.  If symptoms become significantly worse during the night or over the weekend, proceed to the local emergency room.  Followup with Family Doctor if not improved in one week.  Also recommend urology follow-up.    Lattie Haw, MD 10/10/15 [phone]

## 2015-10-13 ENCOUNTER — Telehealth: Payer: Self-pay | Admitting: Emergency Medicine

## 2015-10-13 LAB — URINE CULTURE

## 2022-10-22 ENCOUNTER — Encounter: Payer: Self-pay | Admitting: Podiatrist

## 2022-10-22 ENCOUNTER — Ambulatory Visit: Payer: Medicare PPO | Admitting: Podiatrist

## 2022-10-22 DIAGNOSIS — M79675 Pain in left toe(s): Secondary | ICD-10-CM | POA: Diagnosis not present

## 2022-10-22 DIAGNOSIS — B351 Tinea unguium: Secondary | ICD-10-CM

## 2022-10-22 DIAGNOSIS — M79674 Pain in right toe(s): Secondary | ICD-10-CM | POA: Diagnosis not present

## 2022-10-22 NOTE — Progress Notes (Signed)
Subjective: Meredith Wilkins is a 81 y.o. female patient who presents to office today with concern of long,mildly painful nails  while ambulating in shoes; unable to trim.  Patient denies any new cramping, numbness, burning or tingling in the legs or feet.  There are no problems to display for this patient.  Current Outpatient Medications on File Prior to Visit  Medication Sig Dispense Refill   azelastine (ASTELIN) 137 MCG/SPRAY nasal spray Place into both nostrils 2 (two) times daily. Use in each nostril as directed     fluticasone (VERAMYST) 27.5 MCG/SPRAY nasal spray Place 2 sprays into the nose daily.     levothyroxine (SYNTHROID, LEVOTHROID) 100 MCG tablet Take 100 mcg by mouth daily before breakfast.     lisinopril (PRINIVIL,ZESTRIL) 10 MG tablet Take 10 mg by mouth daily.     nebivolol (BYSTOLIC) 10 MG tablet Take 10 mg by mouth daily.     nitrofurantoin, macrocrystal-monohydrate, (MACROBID) 100 MG capsule Take 1 capsule (100 mg total) by mouth 2 (two) times daily. Take with food. 14 capsule 0   omeprazole (PRILOSEC) 40 MG capsule Take 40 mg by mouth daily.     penicillin v potassium (VEETID) 500 MG tablet Take 1 tablet (500 mg total) by mouth 3 (three) times daily. 21 tablet 0   pravastatin (PRAVACHOL) 40 MG tablet Take 40 mg by mouth daily.     No current facility-administered medications on file prior to visit.   Allergies  Allergen Reactions   Other Rash    PATIENT REPORTS SHE IS ALLERGIC TO ALL ANTIBIOTICS EXCEPT PENICILLIN     Objective: General: Patient is awake, alert, and oriented x 3 and in no acute distress.  Integument: Skin is warm, dry and supple bilateral. Nails are tender, long, thickened and  dystrophic with subungual debris, consistent with onychomycosis, 1-5 bilateral. No signs of infection. Hyperkeratotic tissue medial hallux bilateral.  Noted.   Vasculature:  Dorsalis Pedis pulse 2/4 bilateral. Posterior Tibial pulse  1/4 bilateral.  Capillary fill  time <3 sec 1-5 bilateral. Positive hair growth to the level of the digits. Temperature gradient within normal limits. No varicosities present bilateral. No edema present bilateral.   Neurology: The patient has intact sensation measured with a 5.07/10g Semmes Weinstein Monofilament at all pedal sites bilateral . Vibratory sensation diminished bilateral with tuning fork. No Babinski sign present bilateral.   Musculoskeletal: Hallux abducto valgus deformities bilateral.  Hammertoe second bilateral. . Muscular strength 5/5 in all lower extremity muscular groups bilateral without pain on range of motion . No tenderness with calf compression bilateral.  Assessment and Plan:   ICD-10-CM   1. Pain due to onychomycosis of toenails of both feet  B35.1    M79.675    M79.674         -Examined patient. -Mechanically debrided all nails 1-5 bilateral using sterile nail nipper and filed with sterile dremel without incident  -Answered all patient questions -Patient to return  in 3 months for continued foot care.  -Patient advised to call the office if any problems or questions arise in the meantime.  Delories Heinz, DPM

## 2023-01-21 ENCOUNTER — Ambulatory Visit (INDEPENDENT_AMBULATORY_CARE_PROVIDER_SITE_OTHER): Payer: Medicare PPO | Admitting: Podiatry

## 2023-01-21 DIAGNOSIS — Z91199 Patient's noncompliance with other medical treatment and regimen due to unspecified reason: Secondary | ICD-10-CM

## 2023-01-21 NOTE — Progress Notes (Signed)
No show

## 2024-09-01 ENCOUNTER — Encounter: Payer: Self-pay | Admitting: Podiatry

## 2024-09-01 ENCOUNTER — Ambulatory Visit (INDEPENDENT_AMBULATORY_CARE_PROVIDER_SITE_OTHER): Admitting: Podiatry

## 2024-09-01 DIAGNOSIS — E1142 Type 2 diabetes mellitus with diabetic polyneuropathy: Secondary | ICD-10-CM

## 2024-09-01 DIAGNOSIS — L84 Corns and callosities: Secondary | ICD-10-CM

## 2024-09-01 NOTE — Progress Notes (Signed)
 This patient presents to the office with chief complaint of painful and diabetic feet.  This patient  says there  is  pain and discomfort in their feet due to her corns .    Patient has no history of infection or drainage from both feet.  Patient is unable to  self treat hier own corns.. This patient presents  to the office today for treatment of the  long nails and a foot evaluation due to history of  diabetes.  General Appearance  Alert, conversant and in no acute stress.  Vascular  Dorsalis pedis and posterior tibial  pulses are palpable  bilaterally.  Capillary return is within normal limits  bilaterally. Temperature is within normal limits  bilaterally.  Neurologic  Senn-Weinstein monofilament wire test absent  bilaterally. Muscle power within normal limits bilaterally.  Nails Thick disfigured discolored nails with subungual debris  from hallux to fifth toes bilaterally. No evidence of bacterial infection or drainage bilaterally.  Orthopedic  No limitations of motion of motion feet .  No crepitus or effusions noted.  Severe  HAV  B/L.  Contracted digits  B/L.  Skin  normotropic skin with no porokeratosis noted bilaterally.  No signs of infections or ulcers noted.   Corns hallux  B/L.  Corn fourth right foot.  Onychomycosis  Diabetes with no foot complications  Debride corns  B/L A diabetic foot exam was performed and there is no evidence of any vascular or neurologic pathology.   RTC 3 months.   Cordella Bold DPM

## 2024-11-27 ENCOUNTER — Encounter: Payer: Self-pay | Admitting: Podiatry

## 2024-11-27 ENCOUNTER — Ambulatory Visit: Admitting: Podiatry

## 2024-11-27 DIAGNOSIS — L84 Corns and callosities: Secondary | ICD-10-CM | POA: Diagnosis not present

## 2024-11-27 DIAGNOSIS — E1142 Type 2 diabetes mellitus with diabetic polyneuropathy: Secondary | ICD-10-CM

## 2024-11-27 NOTE — Progress Notes (Signed)
 This patient presents to the office with chief complaint of painful and diabetic feet.  This patient  says there  is  pain and discomfort in their feet due to her corns .    Patient has no history of infection or drainage from both feet.  Patient is unable to  self treat hier own corns.. This patient presents  to the office today for treatment of the  long nails and a foot evaluation due to history of  diabetes.  General Appearance  Alert, conversant and in no acute stress.  Vascular  Dorsalis pedis and posterior tibial  pulses are palpable  bilaterally.  Capillary return is within normal limits  bilaterally. Temperature is within normal limits  bilaterally.  Neurologic  Senn-Weinstein monofilament wire test absent  bilaterally. Muscle power within normal limits bilaterally.  Nails Thick disfigured discolored nails with subungual debris  from hallux to fifth toes bilaterally. No evidence of bacterial infection or drainage bilaterally.  Orthopedic  No limitations of motion of motion feet .  No crepitus or effusions noted.  Severe  HAV  B/L.  Contracted digits  B/L.  Skin  normotropic skin with no porokeratosis noted bilaterally.  No signs of infections or ulcers noted.   Corns hallux  B/L.  Corn fourth right foot.  Callus  B/L. Diabetes with no foot complications  Debride corns  B/L .  Nails were debrided as a courtesy. RTC 3 months.   Cordella Bold DPM

## 2025-03-01 ENCOUNTER — Ambulatory Visit: Admitting: Podiatry
